# Patient Record
Sex: Male | Born: 1975 | Race: Black or African American | Hispanic: No | Marital: Married | Smoking: Never smoker
Health system: Southern US, Community
[De-identification: ages and names within clinical notes are randomized; demographics above are authoritative.]

## PROBLEM LIST (undated history)

## (undated) HISTORY — PX: KNEE ARTHROSCOPY W/ ACL RECONSTRUCTION: SHX1858

## (undated) HISTORY — PX: KNEE SURGERY: SHX244

---

## 2012-06-19 ENCOUNTER — Emergency Department (HOSPITAL_COMMUNITY): Payer: BC Managed Care – PPO

## 2012-06-19 ENCOUNTER — Encounter (HOSPITAL_COMMUNITY): Payer: Self-pay | Admitting: Emergency Medicine

## 2012-06-19 ENCOUNTER — Emergency Department (HOSPITAL_COMMUNITY)
Admission: EM | Admit: 2012-06-19 | Discharge: 2012-06-19 | Disposition: A | Discharge status: Home / Self Care | Payer: BC Managed Care – PPO | Attending: Emergency Medicine | Admitting: Emergency Medicine

## 2012-06-19 DIAGNOSIS — R0602 Shortness of breath: Secondary | ICD-10-CM | POA: Insufficient documentation

## 2012-06-19 DIAGNOSIS — R002 Palpitations: Secondary | ICD-10-CM

## 2012-06-19 DIAGNOSIS — E876 Hypokalemia: Secondary | ICD-10-CM | POA: Insufficient documentation

## 2012-06-19 DIAGNOSIS — R209 Unspecified disturbances of skin sensation: Secondary | ICD-10-CM | POA: Insufficient documentation

## 2012-06-19 DIAGNOSIS — R0789 Other chest pain: Secondary | ICD-10-CM | POA: Insufficient documentation

## 2012-06-19 LAB — POCT I-STAT, CHEM 8
BUN: 18 mg/dL (ref 6–23)
Calcium, Ion: 1.22 mmol/L (ref 1.12–1.23)
Chloride: 106 meq/L (ref 96–112)
Creatinine, Ser: 1.3 mg/dL (ref 0.50–1.35)
Glucose, Bld: 114 mg/dL — ABNORMAL HIGH (ref 70–99)
HCT: 43 % (ref 39.0–52.0)
Hemoglobin: 14.6 g/dL (ref 13.0–17.0)
Potassium: 3 mEq/L — ABNORMAL LOW (ref 3.5–5.1)
Sodium: 144 meq/L (ref 135–145)
TCO2: 29 mmol/L (ref 0–100)

## 2012-06-19 LAB — POCT I-STAT TROPONIN I: Troponin i, poc: 0.01 ng/mL (ref 0.00–0.08)

## 2012-06-19 MED ORDER — ASPIRIN 81 MG PO CHEW
324.0000 mg | CHEWABLE_TABLET | Freq: Once | ORAL | Status: AC
  Administered 2012-06-19: 324 mg via ORAL
  Filled 2012-06-19: qty 4

## 2012-06-19 MED ORDER — POTASSIUM CHLORIDE CRYS ER 20 MEQ PO TBCR
40.0000 meq | EXTENDED_RELEASE_TABLET | Freq: Once | ORAL | Status: AC
  Administered 2012-06-19: 40 meq via ORAL
  Filled 2012-06-19: qty 2

## 2012-06-19 NOTE — ED Provider Notes (Signed)
History     CSN: [id number]  Arrival date & time 06/19/12  0159   First MD Initiated Contact with Patient 06/19/12 0211      Chief Complaint  Patient presents with  . Palpitations   HPI  History provided by the patient. Patient is a 37 year old African American male with no significant PMH who presents with complaints of heart palpitations and shortness of breath. Patient remembers waking up suddenly feeling heart palpitations and shortness of breath symptoms around 1:30 this morning. He also reports some tightness to the chest and numbness sensation down his left upper extremity. He states that he had to "concentrate on his breathing". Symptoms began to improve after arriving to the emergency room approximately 30 minutes later. He did not use any other treatments for his symptoms. He denies having similar symptoms previously. He denies any past history is of frequent night awakenings. Patient isn't sure if he snores or not. He denies any other changes during the day. Pt does admit to high levels of caffeine use including red bulls regularly and soda.  He denies any drug or alcohol use. He is a nonsmoker. He has no significant family history of early cardiac death. There is also no significant immediate family history for hypertension or diabetes.    History reviewed. No pertinent past medical history.  History reviewed. No pertinent past surgical history.  No family history on file.  History  Substance Use Topics  . Smoking status: Never Smoker   . Smokeless tobacco: Not on file  . Alcohol Use: No      Review of Systems  Constitutional: Negative for fever, chills and diaphoresis.  HENT: Negative for congestion and rhinorrhea.   Respiratory: Positive for shortness of breath. Negative for cough.   Cardiovascular: Positive for chest pain and palpitations. Negative for leg swelling.  Gastrointestinal: Negative for nausea, vomiting, abdominal pain, diarrhea and constipation.   Neurological: Positive for numbness. Negative for dizziness, weakness, light-headedness and headaches.  All other systems reviewed and are negative.    Allergies  Review of patient's allergies indicates no known allergies.  Home Medications  No current outpatient prescriptions on file.  BP 153/101  Pulse 81  Temp(Src) 97.8 F (36.6 C) (Oral)  Resp 16  SpO2 99%  Physical Exam  Nursing note and vitals reviewed. Constitutional: He is oriented to person, place, and time. He appears well-developed and well-nourished. No distress.  HENT:  Head: Normocephalic and atraumatic.  Cardiovascular: Normal rate and regular rhythm.   No murmur heard. Pulmonary/Chest: Effort normal and breath sounds normal. No respiratory distress. He has no wheezes. He has no rales. He exhibits no tenderness.  Abdominal: Soft. There is no tenderness. There is no rebound and no guarding.  Musculoskeletal: Normal range of motion. He exhibits no edema and no tenderness.  Neurological: He is alert and oriented to person, place, and time.  Skin: Skin is warm and dry. He is not diaphoretic.  Psychiatric: He has a normal mood and affect. His behavior is normal.    ED Course  Procedures   Results for orders placed during the hospital encounter of 06/19/12  POCT I-STAT, CHEM 8      Result Value Range   Sodium 144  135 - 145 mEq/L   Potassium 3.0 (*) 3.5 - 5.1 mEq/L   Chloride 106  96 - 112 mEq/L   BUN 18  6 - 23 mg/dL   Creatinine, Ser 4.09  0.50 - 1.35 mg/dL   Glucose, Bld 811 (*)  70 - 99 mg/dL   Calcium, Ion 8.65  7.84 - 1.23 mmol/L   TCO2 29  0 - 100 mmol/L   Hemoglobin 14.6  13.0 - 17.0 g/dL   HCT 69.6  29.5 - 28.4 %  POCT I-STAT TROPONIN I      Result Value Range   Troponin i, poc 0.01  0.00 - 0.08 ng/mL   Comment 3               Dg Chest 2 View  06/19/2012  *RADIOLOGY REPORT*  Clinical Data: Palpitations, shortness of breath.  CHEST - 2 VIEW  Comparison: None.  Findings: Hypoaeration with  interstitial and vascular crowding. Mild left lung base linear opacity, favor atelectasis. Cardiomediastinal size within normal range in the setting of hypoaeration.  No pleural effusion or pneumothorax.  No acute osseous finding.  IMPRESSION: Hypoaeration with mild left lung base atelectasis.  Otherwise, no definite radiographic evidence of an acute process.   Original Report Authenticated By: Jearld Lesch, M.D.      1. Heart palpitations   2. Hypokalemia       MDM  2:10AM patient seen and evaluated. Patient sitting in bed at this time without significant symptoms.   Patient continues to appear well. Patient with slight hypokalemia. Potassium given.Lab tests, EKG and chest x-ray are otherwise unremarkable. At this time patient felt able to return home.    Date: 06/19/2012  Rate: 77  Rhythm: normal sinus rhythm  QRS Axis: normal  Intervals: normal  ST/T Wave abnormalities: nonspecific ST/T changes  Conduction Disutrbances:none  Narrative Interpretation:   Old EKG Reviewed: none available    Angus Seller, [place] 06/19/12 [phone]

## 2012-06-19 NOTE — ED Provider Notes (Signed)
Medical screening examination/treatment/procedure(s) were performed by non-physician practitioner and as supervising physician I was immediately available for consultation/collaboration.   Shanedra Lave Y. Aura Bibby, MD 06/19/12 0722 

## 2012-06-19 NOTE — ED Notes (Signed)
PT. WOKE UP FROM SLEEP WITH PALPITATIONS  / CHEST " TIGHTNESS " , LEFT ARM TINGLING AND SOB .

## 2012-06-19 NOTE — ED Notes (Signed)
Pt states he awoke from sleep this morning at 0130 this morning with a feeling of his "heart beating outside of his chest" and was SOB.  States he does not feel this right now.  Denies n/v/d, pt alert and oriented.

## 2012-06-19 NOTE — ED Notes (Signed)
Patient transported to X-ray 

## 2013-07-10 ENCOUNTER — Encounter (HOSPITAL_COMMUNITY): Payer: Self-pay | Admitting: Emergency Medicine

## 2013-07-10 ENCOUNTER — Emergency Department (HOSPITAL_COMMUNITY)
Admission: EM | Admit: 2013-07-10 | Discharge: 2013-07-10 | Disposition: A | Discharge status: Home / Self Care | Payer: BC Managed Care – PPO | Attending: Emergency Medicine | Admitting: Emergency Medicine

## 2013-07-10 DIAGNOSIS — Y92410 Unspecified street and highway as the place of occurrence of the external cause: Secondary | ICD-10-CM | POA: Insufficient documentation

## 2013-07-10 DIAGNOSIS — M545 Low back pain, unspecified: Secondary | ICD-10-CM

## 2013-07-10 DIAGNOSIS — M25519 Pain in unspecified shoulder: Secondary | ICD-10-CM | POA: Insufficient documentation

## 2013-07-10 DIAGNOSIS — M542 Cervicalgia: Secondary | ICD-10-CM

## 2013-07-10 DIAGNOSIS — M25512 Pain in left shoulder: Secondary | ICD-10-CM

## 2013-07-10 DIAGNOSIS — R51 Headache: Secondary | ICD-10-CM | POA: Insufficient documentation

## 2013-07-10 DIAGNOSIS — Y9389 Activity, other specified: Secondary | ICD-10-CM | POA: Insufficient documentation

## 2013-07-10 MED ORDER — ACETAMINOPHEN 325 MG PO TABS
650.0000 mg | ORAL_TABLET | Freq: Once | ORAL | Status: AC
Start: 2013-07-10 — End: 2013-07-10
  Administered 2013-07-10: 650 mg via ORAL
  Filled 2013-07-10: qty 2

## 2013-07-10 MED ORDER — NAPROXEN 500 MG PO TABS: 500.0000 mg | ORAL_TABLET | Freq: Two times a day (BID) | ORAL | Status: DC

## 2013-07-10 MED ORDER — CYCLOBENZAPRINE HCL 5 MG PO TABS: 5.0000 mg | ORAL_TABLET | Freq: Three times a day (TID) | ORAL | Status: DC | PRN
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
## 2013-07-10 NOTE — Discharge Instructions (Signed)
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  Dental services are provided in all areas of dental care including fillings, crowns and bridges, complete and partial dentures, implants, gum treatment,  root canals, and extractions. Preventive care is also provided. Treatment is provided to both adults and children. Patients are selected via a lottery and there is often a waiting list.   [facility] [address], [place]  [phone] [email or web address]   [facility] [address], [place], [place] [phone], Ext. [phone] Second and Fourth Thursday of each month, opens at 6:30 AM; Clinic ends at 9 AM.  Patients are seen on a first-come first-served basis, and a limited number are seen during each clinic.   [facility]  [address], [place], [place] [phone]   Eligibility Requirements You must have lived in [place], [place], or [place] counties for at least the last three months.   You cannot be eligible for state or federal sponsored [place] Cityhealthcare insurance, including [employer]eterans [employer], [place]icaid, or [employer] EntertainmentMedicare.   You generally cannot be eligible for healthcare insurance through your employer.    How to apply: Eligibility screenings are held every Tuesday and Wednesday afternoon from 1:00 pm until 4:00 pm. You do not need an appointment for the interview!  [facility] [address], [place], [place] [phone]   [facility]  [phone]   [facility]  [phone]   [facility]  [phone]    Behavioral Health Resources in the Community: Intensive Outpatient Programs Organization         Address  Phone  Notes  [facility] [address], [place], [place] [phone]   [facility] [address], [place], [place] [phone]   [facility] [address], [place], [place]  [phone]   [facility] [address],  [place], [place] [phone] or [phone]   Substance Abuse Resources Organization         Address  Phone  Notes  [facility]   [phone]   [facility]  [phone]   [facility]  [phone]   [facility]  [phone]   Residential & Outpatient Substance Abuse Program  [phone]   Psychological Services Organization         Address  Phone  Notes  [facility]  [phone]   [facility]  [phone]   [facility] [address], [place] [phone] or [phone]    Mobile Crisis Teams Organization         Address  Phone  Notes  [facility], Mobile Crisis Care Unit  [phone]   [facility]  [address] [place], [place] [phone]   Doristine Locks [address] [place] [phone]    Self-Help/Support Groups Organization         Address  Phone             Notes  [facility] - variety of support groups  [phone]- I7437963 Call for more information  Narcotics Anonymous (NA), [facility] [address], [employer] [place]  2 meetings at this location   Statistician         Address  Phone  Notes  [facility] [address],    [place]  [phone]   [facility]  [address], [place] [address], [place], [place] [phone]   [facility] [address] [place], [place] [phone] Admissions: 8am-3pm M-F  [facility] [address],    [place], [place] [phone]   [facility] [address] [place], [place], [place] [phone]   [facility] [address]  [place], [place] [phone]   [facility] - Intensive Outpatient [address], [place], [place] [phone]   [facility] [address]  [place], [place] [phone] or [phone]   [facility] ([facility]) [address] [place], [place] [phone] Accepts Medicaid  [facility] [address],  [place] [phone] Substance Abuse/Addiction Treatment   [facility] Organization         Address  Phone  Notes  [facility]  [phone]   Angie Fava, PhD [address] [place] [place], [place]   [phone] or [phone]   [facility]   [address] [place], [place] [phone]   [facility] [address] [place], [place] [phone] Insurance/Medicaid/sponsorship through [facility] [address]                                    [place], [place] [phone] Therapy/tele-psych/case  [facility] [address], [place] [phone]    Dr. Lolly Mustache  [phone]   [facility]  [employer] [facility] Dept. 1) [address], [place] 2) [address] [place] 3)  [address], [place] [phone]  [phone]   [facility] Child Abuse Hotline [phone] or [phone] (After Hours)

## 2013-07-11 NOTE — ED Provider Notes (Signed)
Medical screening examination/treatment/procedure(s) were performed by non-physician practitioner and as supervising physician I was immediately available for consultation/collaboration.   EKG Interpretation None       Flint MelterElliott L Ajeet Casasola, MD 07/11/13 2115

## 2013-07-14 ENCOUNTER — Emergency Department (INDEPENDENT_AMBULATORY_CARE_PROVIDER_SITE_OTHER)
Admission: EM | Admit: 2013-07-14 | Discharge: 2013-07-14 | Disposition: A | Discharge status: Home / Self Care | Payer: BC Managed Care – PPO | Source: Home / Self Care | Attending: Family Medicine | Admitting: Family Medicine

## 2013-07-14 ENCOUNTER — Encounter (HOSPITAL_COMMUNITY): Payer: Self-pay | Admitting: Emergency Medicine

## 2013-07-14 DIAGNOSIS — T148XXA Other injury of unspecified body region, initial encounter: Secondary | ICD-10-CM
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
## 2013-07-14 NOTE — Discharge Instructions (Signed)
Try soaking your sore muscles in warm water with epsom salt (or use warm water with epsom salt on a towel or washcloth) 2-3 times per day. Get the prescriptions from the ER filled and take them instead of the meloxicam. Try to do gentle stretching of your neck, back and shoulder daily.   Muscle Strain A muscle strain is an injury that occurs when a muscle is stretched beyond its normal length. Usually a small number of muscle fibers are torn when this happens. Muscle strain is rated in degrees. First-degree strains have the least amount of muscle fiber tearing and pain. Second-degree and third-degree strains have increasingly more tearing and pain.  Usually, recovery from muscle strain takes 1 2 weeks. Complete healing takes 5 6 weeks.  CAUSES  Muscle strain happens when a sudden, violent force placed on a muscle stretches it too far. This may occur with lifting, sports, or a fall.  RISK FACTORS Muscle strain is especially common in athletes.  SIGNS AND SYMPTOMS At the site of the muscle strain, there may be:  Pain.  Bruising.  Swelling.  Difficulty using the muscle due to pain or lack of normal function. DIAGNOSIS  Your health care provider will perform a physical exam and ask about your medical history. TREATMENT  Often, the best treatment for a muscle strain is resting, icing, and applying cold compresses to the injured area.  HOME CARE INSTRUCTIONS   Use the PRICE method of treatment to promote muscle healing during the first 2 3 days after your injury. The PRICE method involves:  Protecting the muscle from being injured again.  Restricting your activity and resting the injured body part.  Icing your injury. To do this, put ice in a plastic bag. Place a towel between your skin and the bag. Then, apply the ice and leave it on from 15 20 minutes each hour. After the third day, switch to moist heat packs.  Apply compression to the injured area with a splint or elastic bandage. Be  careful not to wrap it too tightly. This may interfere with blood circulation or increase swelling.  Elevate the injured body part above the level of your heart as often as you can.  Only take over-the-counter or prescription medicines for pain, discomfort, or fever as directed by your health care provider.  Warming up prior to exercise helps to prevent future muscle strains. SEEK MEDICAL CARE IF:   You have increasing pain or swelling in the injured area.  You have numbness, tingling, or a significant loss of strength in the injured area. MAKE SURE YOU:   Understand these instructions.  Will watch your condition.  Will get help right away if you are not doing well or get worse. Document Released: 04/07/2005 Document Revised: 01/26/2013 Document Reviewed: 11/04/2012 [facility] Patient Information 2014 [place], [place].

## 2013-07-14 NOTE — ED Provider Notes (Signed)
Medical screening examination/treatment/procedure(s) were performed by a resident physician or non-physician practitioner and as the supervising physician I was immediately available for consultation/collaboration.  Emmarose Klinke, MD    Manley Fason S Cordera Stineman, MD 07/14/13 1816 

## 2014-02-01 ENCOUNTER — Ambulatory Visit (INDEPENDENT_AMBULATORY_CARE_PROVIDER_SITE_OTHER): Payer: BC Managed Care – PPO

## 2014-02-01 ENCOUNTER — Encounter: Payer: Self-pay | Admitting: Podiatrist

## 2014-02-01 ENCOUNTER — Ambulatory Visit (INDEPENDENT_AMBULATORY_CARE_PROVIDER_SITE_OTHER): Payer: BC Managed Care – PPO | Admitting: Podiatrist

## 2014-02-01 VITALS — BP 125/84 | HR 69 | Resp 16 | Ht 72.0 in | Wt 243.0 lb

## 2014-02-01 DIAGNOSIS — M216X2 Other acquired deformities of left foot: Secondary | ICD-10-CM

## 2014-02-01 DIAGNOSIS — M779 Enthesopathy, unspecified: Secondary | ICD-10-CM

## 2014-02-01 DIAGNOSIS — M7752 Other enthesopathy of left foot: Secondary | ICD-10-CM

## 2014-02-01 DIAGNOSIS — M71572 Other bursitis, not elsewhere classified, left ankle and foot: Secondary | ICD-10-CM

## 2014-02-01 NOTE — Patient Instructions (Signed)
Corns and Calluses Corns are small areas of thickened skin that usually occur on the top, sides, or tip of a toe. They contain a cone-shaped core with a point that can press on a nerve below. This causes pain. Calluses are areas of thickened skin that usually develop on hands, fingers, palms, soles of the feet, and heels. These are areas that experience frequent friction or pressure. CAUSES  Corns are usually the result of rubbing (friction) or pressure from shoes that are too tight or do not fit properly. Calluses are caused by repeated friction and pressure on the affected areas. SYMPTOMS  A hard growth on the skin.  Pain or tenderness under the skin.  Sometimes, redness and swelling.  Increased discomfort while wearing tight-fitting shoes. DIAGNOSIS  Your caregiver can usually tell what the problem is by doing a physical exam. TREATMENT  Removing the cause of the friction or pressure is usually the only treatment needed. However, sometimes medicines can be used to help soften the hardened, thickened areas. These medicines include salicylic acid plasters and 12% ammonium lactate lotion. These medicines should only be used under the direction of your caregiver. HOME CARE INSTRUCTIONS   Try to remove pressure from the affected area.  You may wear donut-shaped corn pads to protect your skin.  You may use a pumice stone or nonmetallic nail file to gently reduce the thickness of a corn.  Wear properly fitted footwear.  If you have calluses on the hands, wear gloves during activities that cause friction.  If you have diabetes, you should regularly examine your feet. Tell your caregiver if you notice any problems with your feet. SEEK IMMEDIATE MEDICAL CARE IF:   You have increased pain, swelling, redness, or warmth in the affected area.  Your corn or callus starts to drain fluid or bleeds.  You are not getting better, even with treatment. Document Released: 01/12/2004 Document  Revised: 06/30/2011 Document Reviewed: 12/03/2010 ExitCare Patient Information 2015 Exit[employer], [employer]. This information is not intended to replace advice given to you by your health care provider. Make sure you discuss any questions you have with your health care provider.  

## 2014-02-01 NOTE — Progress Notes (Signed)
   Subjective:    Patient ID: Lance Rungliver Penniman Jr., male    DOB: 02/27/1976, 38 y.o.   MRN: [id number]  HPI Comments: "I have pain on the left foot"  Patient c/o sharp sub 5th MPJ left for 3-4 months. There is a callused area. He's tried filing down. Make worse. Walking a lot aggravates.  Foot Pain      Review of Systems  All other systems reviewed and are negative.      Objective:   Physical Exam Patient is awake, alert, and oriented x 3.  In no acute distress.  Vascular status is intact with palpable pedal pulses at 2/4 DP and PT bilateral and capillary refill time within normal limits. Neurological sensation is also intact bilaterally via Semmes Weinstein monofilament at 5/5 sites. Light touch, vibratory sensation, Achilles tendon reflex is intact. Dermatological exam reveals skin color, turger and texture as normal. No open lesions present.  Musculature intact with dorsiflexion, plantarflexion, inversion, eversion.  Pes planus noted bilateral.  Callus submet five left is present.  Palpable bursa like area is noted submet 5 left as well in the area of tenderness and pain.  xrays negative for fracture.       Assessment & Plan:  inflammed bursae submet 5 left foot.  Pes plauns  Plan:  Under sterile technique i injected the plantar 5th metatarsal head with dexamethasone and marcaine mix.  Patient tolerated this well.  i also debrided the callus and dispensed offloading pads.  Will consider orthotics in the future if the padding is beneficial.

## 2015-02-23 IMAGING — CR DG CHEST 2V
2 series · 2 of 2 positions shown · non-contrast
Comparison: None.

CLINICAL DATA: Palpitations, shortness of breath.

CHEST - 2 VIEW

[w chest pa]
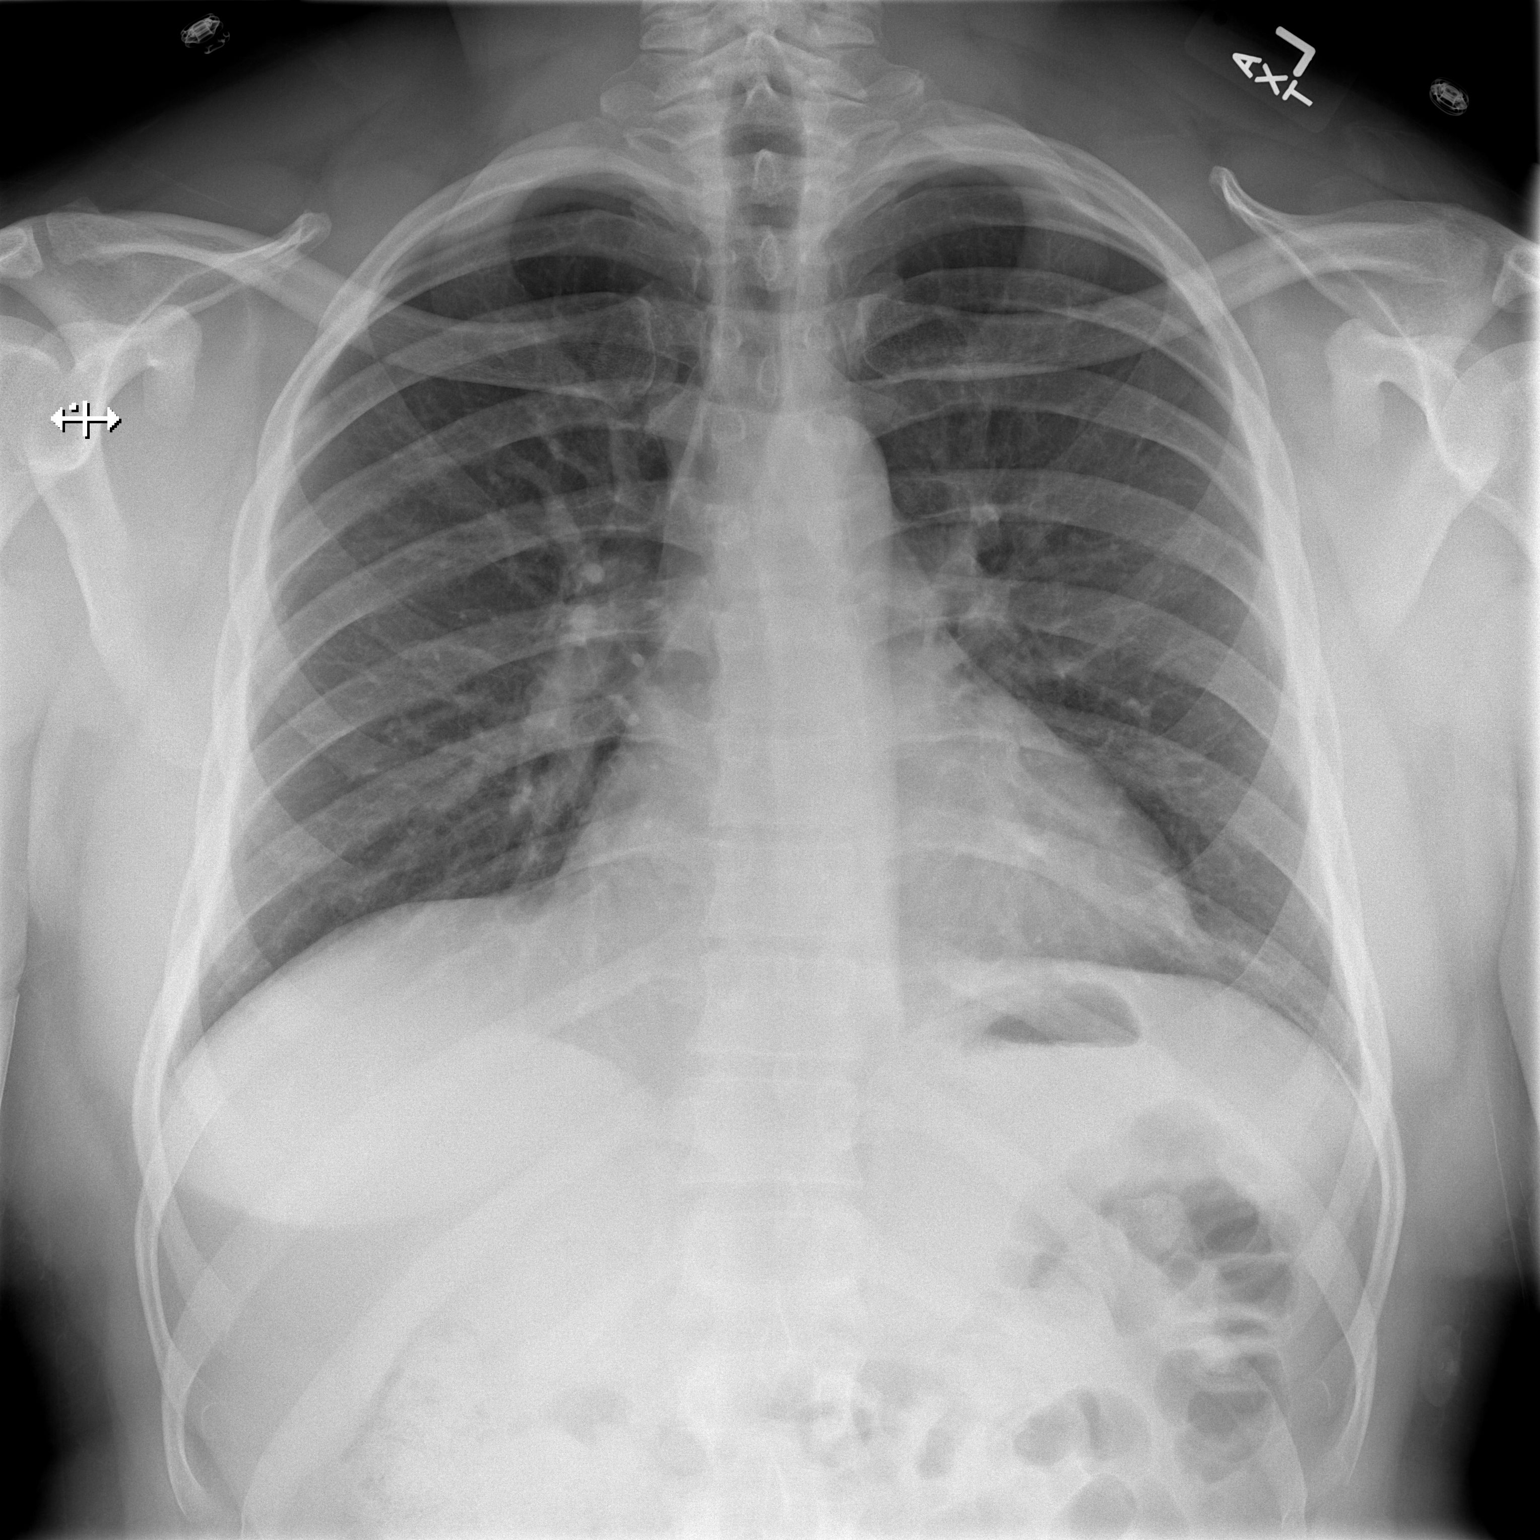

[w chest lat]
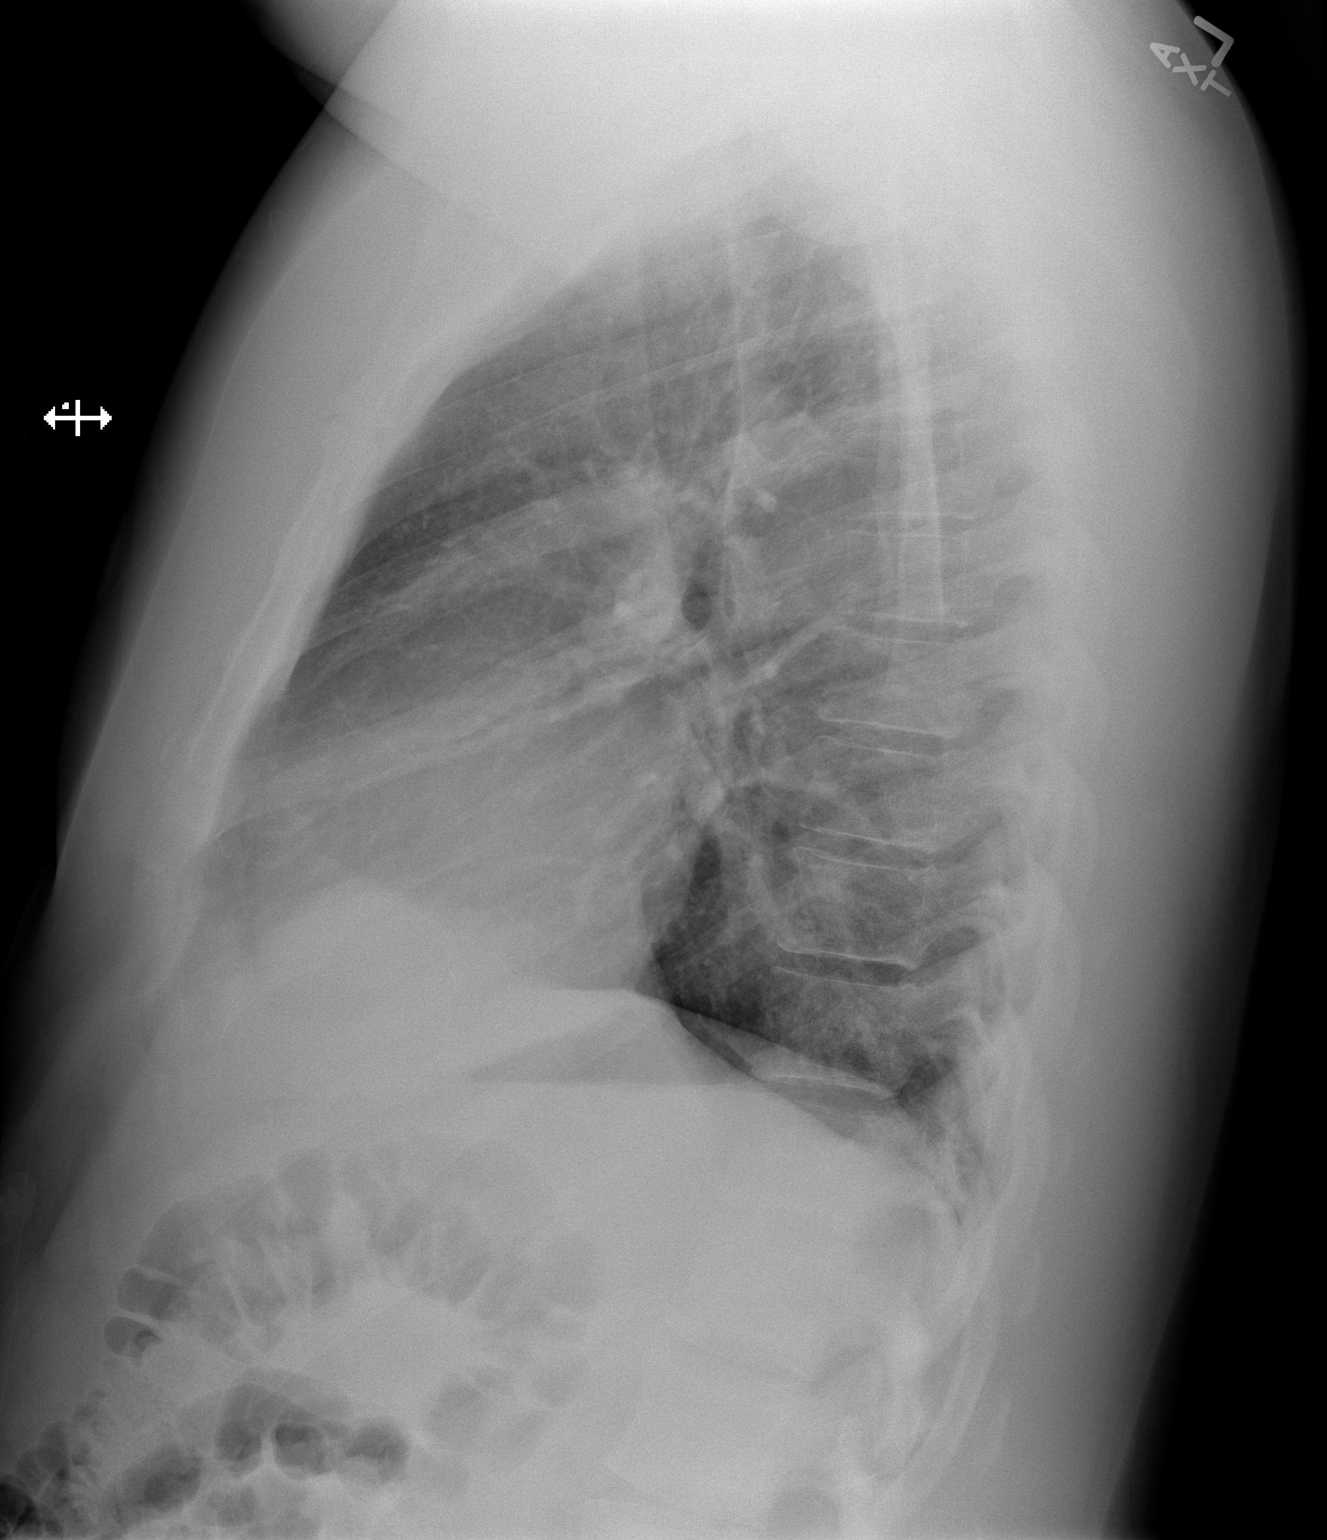

[2 of 2 positions shown; findings below may reference images not displayed]

FINDINGS: Hypoaeration with interstitial and vascular crowding.
Mild left lung base linear opacity, favor atelectasis.
Cardiomediastinal size within normal range in the setting of
hypoaeration.  No pleural effusion or pneumothorax.  No acute
osseous finding.
IMPRESSION: Hypoaeration with mild left lung base atelectasis.

Otherwise, no definite radiographic evidence of an acute process.

## 2017-02-26 DIAGNOSIS — M2042 Other hammer toe(s) (acquired), left foot: Secondary | ICD-10-CM | POA: Diagnosis not present

## 2017-02-26 DIAGNOSIS — L97529 Non-pressure chronic ulcer of other part of left foot with unspecified severity: Secondary | ICD-10-CM | POA: Diagnosis not present

## 2017-02-26 DIAGNOSIS — M2041 Other hammer toe(s) (acquired), right foot: Secondary | ICD-10-CM | POA: Diagnosis not present

## 2017-02-26 DIAGNOSIS — L97519 Non-pressure chronic ulcer of other part of right foot with unspecified severity: Secondary | ICD-10-CM | POA: Diagnosis not present

## 2018-11-20 ENCOUNTER — Other Ambulatory Visit: Payer: Self-pay

## 2018-11-20 ENCOUNTER — Ambulatory Visit (HOSPITAL_COMMUNITY)
Admission: EM | Admit: 2018-11-20 | Discharge: 2018-11-20 | Disposition: A | Discharge status: Home / Self Care | Payer: Self-pay | Attending: Family Medicine | Admitting: Family Medicine

## 2018-11-20 ENCOUNTER — Encounter (HOSPITAL_COMMUNITY): Payer: Self-pay | Admitting: *Deleted

## 2018-11-20 DIAGNOSIS — S161XXA Strain of muscle, fascia and tendon at neck level, initial encounter: Secondary | ICD-10-CM

## 2018-11-20 DIAGNOSIS — W2211XA Striking against or struck by driver side automobile airbag, initial encounter: Secondary | ICD-10-CM

## 2018-11-20 DIAGNOSIS — S39012A Strain of muscle, fascia and tendon of lower back, initial encounter: Secondary | ICD-10-CM

## 2018-11-20 MED ORDER — DICLOFENAC SODIUM 75 MG PO TBEC: 75.0000 mg | DELAYED_RELEASE_TABLET | Freq: Two times a day (BID) | ORAL | 0 refills | Status: AC

## 2018-11-20 MED ORDER — CYCLOBENZAPRINE HCL 10 MG PO TABS: ORAL_TABLET | ORAL | 0 refills | Status: AC
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
## 2018-12-06 DIAGNOSIS — M545 Low back pain: Secondary | ICD-10-CM | POA: Diagnosis not present

## 2018-12-06 DIAGNOSIS — M25531 Pain in right wrist: Secondary | ICD-10-CM | POA: Diagnosis not present

## 2018-12-06 DIAGNOSIS — M25532 Pain in left wrist: Secondary | ICD-10-CM | POA: Diagnosis not present

## 2018-12-06 DIAGNOSIS — M542 Cervicalgia: Secondary | ICD-10-CM | POA: Diagnosis not present

## 2018-12-09 DIAGNOSIS — M25531 Pain in right wrist: Secondary | ICD-10-CM | POA: Diagnosis not present

## 2018-12-09 DIAGNOSIS — M19032 Primary osteoarthritis, left wrist: Secondary | ICD-10-CM | POA: Diagnosis not present

## 2018-12-14 DIAGNOSIS — S39012D Strain of muscle, fascia and tendon of lower back, subsequent encounter: Secondary | ICD-10-CM | POA: Diagnosis not present

## 2018-12-14 DIAGNOSIS — M542 Cervicalgia: Secondary | ICD-10-CM | POA: Diagnosis not present

## 2018-12-20 DIAGNOSIS — S39012D Strain of muscle, fascia and tendon of lower back, subsequent encounter: Secondary | ICD-10-CM | POA: Diagnosis not present

## 2018-12-20 DIAGNOSIS — M542 Cervicalgia: Secondary | ICD-10-CM | POA: Diagnosis not present

## 2018-12-22 DIAGNOSIS — S39012D Strain of muscle, fascia and tendon of lower back, subsequent encounter: Secondary | ICD-10-CM | POA: Diagnosis not present

## 2018-12-22 DIAGNOSIS — M542 Cervicalgia: Secondary | ICD-10-CM | POA: Diagnosis not present

## 2018-12-30 DIAGNOSIS — S39012D Strain of muscle, fascia and tendon of lower back, subsequent encounter: Secondary | ICD-10-CM | POA: Diagnosis not present

## 2018-12-30 DIAGNOSIS — M542 Cervicalgia: Secondary | ICD-10-CM | POA: Diagnosis not present

## 2019-01-03 DIAGNOSIS — M542 Cervicalgia: Secondary | ICD-10-CM | POA: Diagnosis not present

## 2019-01-03 DIAGNOSIS — S39012D Strain of muscle, fascia and tendon of lower back, subsequent encounter: Secondary | ICD-10-CM | POA: Diagnosis not present

## 2019-01-10 DIAGNOSIS — M542 Cervicalgia: Secondary | ICD-10-CM | POA: Diagnosis not present

## 2019-01-10 DIAGNOSIS — S39012D Strain of muscle, fascia and tendon of lower back, subsequent encounter: Secondary | ICD-10-CM | POA: Diagnosis not present

## 2019-01-14 DIAGNOSIS — S39012D Strain of muscle, fascia and tendon of lower back, subsequent encounter: Secondary | ICD-10-CM | POA: Diagnosis not present

## 2019-01-14 DIAGNOSIS — M542 Cervicalgia: Secondary | ICD-10-CM | POA: Diagnosis not present

## 2019-01-17 DIAGNOSIS — M542 Cervicalgia: Secondary | ICD-10-CM | POA: Diagnosis not present

## 2019-01-17 DIAGNOSIS — S39012D Strain of muscle, fascia and tendon of lower back, subsequent encounter: Secondary | ICD-10-CM | POA: Diagnosis not present

## 2019-01-19 DIAGNOSIS — S39012D Strain of muscle, fascia and tendon of lower back, subsequent encounter: Secondary | ICD-10-CM | POA: Diagnosis not present

## 2019-01-19 DIAGNOSIS — M542 Cervicalgia: Secondary | ICD-10-CM | POA: Diagnosis not present

## 2019-04-13 DIAGNOSIS — M546 Pain in thoracic spine: Secondary | ICD-10-CM | POA: Diagnosis not present

## 2019-05-12 DIAGNOSIS — M546 Pain in thoracic spine: Secondary | ICD-10-CM | POA: Diagnosis not present
